# Patient Record
Sex: Male | Born: 2000 | Race: Black or African American | Hispanic: No | Marital: Single | Smoking: Never smoker
Health system: Southern US, Community
[De-identification: ages and names within clinical notes are randomized; demographics above are authoritative.]

## PROBLEM LIST (undated history)

## (undated) DIAGNOSIS — J45909 Unspecified asthma, uncomplicated: Secondary | ICD-10-CM

## (undated) HISTORY — DX: Unspecified asthma, uncomplicated: J45.909

---
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
## 2018-01-09 DIAGNOSIS — M79672 Pain in left foot: Secondary | ICD-10-CM | POA: Diagnosis not present

## 2018-01-09 DIAGNOSIS — E669 Obesity, unspecified: Secondary | ICD-10-CM | POA: Diagnosis not present

## 2018-01-22 DIAGNOSIS — S93402A Sprain of unspecified ligament of left ankle, initial encounter: Secondary | ICD-10-CM | POA: Diagnosis not present

## 2018-01-22 DIAGNOSIS — M25572 Pain in left ankle and joints of left foot: Secondary | ICD-10-CM | POA: Diagnosis not present

## 2018-03-28 ENCOUNTER — Encounter (HOSPITAL_COMMUNITY): Payer: Self-pay | Admitting: *Deleted

## 2018-03-28 ENCOUNTER — Emergency Department (HOSPITAL_COMMUNITY): Payer: Medicaid Other

## 2018-03-28 ENCOUNTER — Emergency Department (HOSPITAL_COMMUNITY)
Admission: EM | Admit: 2018-03-28 | Discharge: 2018-03-28 | Disposition: A | Discharge status: Home / Self Care | Payer: Medicaid Other | Attending: Emergency Medicine | Admitting: Emergency Medicine

## 2018-03-28 ENCOUNTER — Other Ambulatory Visit: Payer: Self-pay

## 2018-03-28 DIAGNOSIS — Z79899 Other long term (current) drug therapy: Secondary | ICD-10-CM | POA: Insufficient documentation

## 2018-03-28 DIAGNOSIS — M25572 Pain in left ankle and joints of left foot: Secondary | ICD-10-CM | POA: Insufficient documentation

## 2018-03-28 DIAGNOSIS — J45909 Unspecified asthma, uncomplicated: Secondary | ICD-10-CM | POA: Insufficient documentation

## 2018-03-28 MED ORDER — NAPROXEN 500 MG PO TABS: 500.0000 mg | ORAL_TABLET | Freq: Two times a day (BID) | ORAL | 0 refills | Status: AC

## 2018-03-28 NOTE — ED Triage Notes (Signed)
Pt in c/o L ankle pain with hx of injury in August 2019 after playing basketball with worsening pain with working, no new injury, pt ambulatory, A&O x4

## 2018-03-28 NOTE — Discharge Instructions (Addendum)
You were seen in the emergency department today for ankle pain.  Your x-ray did not show fracture dislocation.  We suggested she continue wearing her brace when you are up moving around.  We are giving a prescription for naproxen to help with pain.  - Naproxen is a nonsteroidal anti-inflammatory medication that will help with pain and swelling. Be sure to take this medication as prescribed with food, 1 pill every 12 hours,  It should be taken with food, as it can cause stomach upset, and more seriously, stomach bleeding. Do not take other nonsteroidal anti-inflammatory medications with this such as Advil, Motrin, Aleve, Mobic, Goodie Powder, or Motrin.    You make take Tylenol per over the counter dosing with these medications.   We have prescribed you new medication(s) today. Discuss the medications prescribed today with your pharmacist as they can have adverse effects and interactions with your other medicines including over the counter and prescribed medications. Seek medical evaluation if you start to experience new or abnormal symptoms after taking one of these medicines, seek care immediately if you start to experience difficulty breathing, feeling of your throat closing, facial swelling, or rash as these could be indications of a more serious allergic reaction   These follow-up with your previous orthopedic doctor or Dr. Everardo Pacific providing her discharge instructions within 1 week.  Return to the ER for new or worsening symptoms or any other concerns.

## 2018-03-28 NOTE — ED Provider Notes (Signed)
[facility] EMERGENCY DEPARTMENT Provider Note   CSN: [id number] Arrival date & time: 03/28/18  0901     History   Chief Complaint Chief Complaint  Patient presents with  . Ankle Pain    HPI Edward Goodman is a 17 y.o. male with a history of asthma who presents to the emergency department with complaints of left ankle pain.  Patient states that he injured his left ankle playing basketball in August of this year-release inversion type injury with popping sensation.  He was seen in urgent care at time of injury with negative x-ray and he states they told him they thought he might have torn a ligament.  He states he followed up with orthopedics prior to going back to school without intervention performed.  He states he is been on his feet and excessive amount at work which has seemed to aggravate the typical discomfort he has had since August.  It is worse with twisting/turning motions of the ankle.  No alleviating factors.  Start utilizing the brace previously provided without improvement.  No medications tried.  Denies fever, chills, change in color, swelling, numbness, feeling, or weakness.  HPI  Past Medical History:  Diagnosis Date  . Asthma     There are no active problems to display for this patient.   History reviewed. No pertinent surgical history.      Home Medications    Prior to Admission medications   Medication Sig Start Date End Date Taking? Authorizing Provider  acetaminophen (TYLENOL) 500 MG tablet Take 500 mg by mouth every 6 (six) hours as needed for headache (pain).    [provider]  albuterol (PROVENTIL HFA;VENTOLIN HFA) 108 (90 Base) MCG/ACT inhaler Inhale into the lungs every 6 (six) hours as needed for wheezing or shortness of breath.    [provider]  diphenhydrAMINE (BENADRYL) 25 mg capsule Take 1 capsule (25 mg total) by mouth every 6 (six) hours as needed. Patient taking differently: Take 25-50 mg by mouth  every 6 (six) hours as needed (allergic reaction).  10/14/16   Maxwell Caul, PA-C  famotidine (PEPCID) 20 MG tablet Take 1 tablet (20 mg total) by mouth daily. Patient not taking: Reported on 11/05/2017 10/14/16   Graciella Freer A, PA-C  fluticasone (FLONASE) 50 MCG/ACT nasal spray Place 2 sprays into both nostrils daily. Patient not taking: Reported on 11/05/2017 07/03/17   Caccavale, Sophia, PA-C  promethazine-dextromethorphan (PROMETHAZINE-DM) 6.25-15 MG/5ML syrup Take 5 mLs by mouth 4 (four) times daily as needed. Patient not taking: Reported on 11/05/2017 07/03/17   Alveria Apley, PA-C    Family History No family history on file.  Social History Social History   Tobacco Use  . Smoking status: Never Smoker  . Smokeless tobacco: Never Used  Substance Use Topics  . Alcohol use: No  . Drug use: No     Allergies   Banana   Review of Systems Review of Systems  Constitutional: Negative for chills and fever.  Musculoskeletal: Positive for arthralgias (L ankle).  Skin: Negative for color change.  Neurological: Negative for weakness and numbness.     Physical Exam Updated Vital Signs BP (!) 127/58 (BP Location: Right Arm)   Pulse 77   Temp 98.2 F (36.8 C) (Oral)   Resp 16   Ht 5\' 11"  (1.803 m)   Wt 102.1 kg   SpO2 100%   BMI 31.38 kg/m   Physical Exam  Constitutional: He appears well-developed and well-nourished. No distress.  HENT:  Head: Normocephalic and atraumatic.  Eyes: Conjunctivae are normal. Right eye exhibits no discharge. Left eye exhibits no discharge.  Cardiovascular:  2+ symmetric DP/PT pulses.  Musculoskeletal:  Lower extremities: No obvious deformity, appreciable swelling, erythema, ecchymosis, or open wounds.  Patient has full active range of motion bilateral knees, ankles, and all digits.  He is tender to palpation over the left lateral ankle ligaments.  There does not appear to be any point/focal bony tenderness.  No malleolar tenderness.   No tenderness at the base of the fifth, navicular bone, or the head of the fibula.  He is neurovascularly intact distally.  Neurological: He is alert.  Clear speech.  Sensation grossly intact bilateral lower extremities.  5 out of 5 strength plantar dorsiflexion bilaterally.  Gait is intact.  Psychiatric: He has a normal mood and affect. His behavior is normal. Thought content normal.  Nursing note and vitals reviewed.    ED Treatments / Results  Labs (all labs ordered are listed, but only abnormal results are displayed) Labs Reviewed - No data to display  EKG None  Radiology Dg Ankle Complete Left  Result Date: 03/28/2018 CLINICAL DATA:  17 year old male status post twisting injury playing basketball. Pain and swelling. EXAM: LEFT ANKLE COMPLETE - 3+ VIEW COMPARISON:  None. FINDINGS: Bone mineralization is within normal limits. Skeletally mature. No joint effusion is evident. Talar dome intact. Mortise joint alignment within normal limits. No fracture. Incidental os trigonum. IMPRESSION: No osseous abnormality identified. Electronically Signed   By: Odessa Fleming M.D.   On: 03/28/2018 09:42    Procedures Procedures (including critical care time)  Medications Ordered in ED Medications - No data to display   Initial Impression / Assessment and Plan / ED Course  I have reviewed the triage vital signs and the nursing notes.  Pertinent labs & imaging results that were available during my care of the patient were reviewed by me and considered in my medical decision making (see chart for details).    Patient presents to the ED with complaints of pain to the left ankle which has been present since injury in 12/2017, worse recently with being on his feet at work. Exam without obvious deformity or open wounds. ROM intact. Tender to palpation over lateral ligaments. NVI distally. No erythema/warmth/fevers to raise concern for infectious etiology. Presentation does not seem consistent with gout.  Xray negative for fracture/dislocation. Recommended continued use of ankle brace with ortho follow up. Prescription for naproxen has been provided. I discussed results, treatment plan, need for follow-up, and return precautions with the patient. Provided opportunity for questions, patient confirmed understanding and are in agreement with plan.   Final Clinical Impressions(s) / ED Diagnoses   Final diagnoses:  Left ankle pain, unspecified chronicity    ED Discharge Orders         Ordered    naproxen (NAPROSYN) 500 MG tablet  2 times daily     03/28/18 0958           Cherly Anderson, PA-C 03/28/18 8119    Marily Memos, MD 03/28/18 1128

## 2018-05-22 ENCOUNTER — Emergency Department (HOSPITAL_COMMUNITY)
Admission: EM | Admit: 2018-05-22 | Discharge: 2018-05-22 | Disposition: A | Discharge status: Home / Self Care | Payer: Medicaid Other | Attending: Emergency Medicine | Admitting: Emergency Medicine

## 2018-05-22 ENCOUNTER — Encounter (HOSPITAL_COMMUNITY): Payer: Self-pay | Admitting: *Deleted

## 2018-05-22 DIAGNOSIS — N4889 Other specified disorders of penis: Secondary | ICD-10-CM | POA: Insufficient documentation

## 2018-05-22 DIAGNOSIS — J45909 Unspecified asthma, uncomplicated: Secondary | ICD-10-CM | POA: Insufficient documentation

## 2018-05-22 DIAGNOSIS — Z202 Contact with and (suspected) exposure to infections with a predominantly sexual mode of transmission: Secondary | ICD-10-CM

## 2018-05-22 DIAGNOSIS — R369 Urethral discharge, unspecified: Secondary | ICD-10-CM

## 2018-05-22 LAB — URINALYSIS, ROUTINE W REFLEX MICROSCOPIC
Bacteria, UA: NONE SEEN
Bilirubin Urine: NEGATIVE
GLUCOSE, UA: NEGATIVE mg/dL
Hgb urine dipstick: NEGATIVE
KETONES UR: NEGATIVE mg/dL
LEUKOCYTES UA: NEGATIVE
Nitrite: NEGATIVE
Protein, ur: 30 mg/dL — AB
Specific Gravity, Urine: 1.021 (ref 1.005–1.030)
pH: 9 — ABNORMAL HIGH (ref 5.0–8.0)

## 2018-05-22 MED ORDER — AZITHROMYCIN 250 MG PO TABS
1000.0000 mg | ORAL_TABLET | Freq: Once | ORAL | Status: AC
  Administered 2018-05-22: 1000 mg via ORAL
  Filled 2018-05-22: qty 4

## 2018-05-22 MED ORDER — CEFTRIAXONE SODIUM 250 MG IJ SOLR
250.0000 mg | Freq: Once | INTRAMUSCULAR | Status: AC
Start: 2018-05-22 — End: 2018-05-22
  Administered 2018-05-22: 250 mg via INTRAMUSCULAR
  Filled 2018-05-22: qty 250
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
## 2018-05-22 NOTE — ED Notes (Signed)
D/c reviewed with patient 
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
## 2018-05-22 NOTE — Discharge Instructions (Signed)
Please read the instructions below.  Talk with your primary care provider about any new medications.  You have been treated today for gonorrhea and chlamydia. You will receive a call from the hospital if your test results come back positive. Avoid sexual activity until you know your test results. If your results come back positive, it is important that you inform all of your sexual partners. Report to the health department for follow up. Return to the ER for new or worsening symptoms.

## 2018-05-23 LAB — HIV ANTIBODY (ROUTINE TESTING W REFLEX): HIV Screen 4th Generation wRfx: NONREACTIVE

## 2018-05-23 LAB — RPR: RPR: NONREACTIVE

## 2018-05-24 LAB — GC/CHLAMYDIA PROBE AMP (~~LOC~~) NOT AT ARMC
Chlamydia: NEGATIVE
NEISSERIA GONORRHEA: NEGATIVE

## 2018-08-12 ENCOUNTER — Encounter (HOSPITAL_COMMUNITY): Payer: Self-pay | Admitting: *Deleted

## 2018-08-12 ENCOUNTER — Emergency Department (HOSPITAL_COMMUNITY)
Admission: EM | Admit: 2018-08-12 | Discharge: 2018-08-12 | Disposition: A | Discharge status: Home / Self Care | Payer: Medicaid Other | Attending: Emergency Medicine | Admitting: Emergency Medicine

## 2018-08-12 ENCOUNTER — Other Ambulatory Visit: Payer: Self-pay

## 2018-08-12 ENCOUNTER — Emergency Department (HOSPITAL_COMMUNITY): Payer: Medicaid Other

## 2018-08-12 DIAGNOSIS — J45909 Unspecified asthma, uncomplicated: Secondary | ICD-10-CM | POA: Insufficient documentation

## 2018-08-12 DIAGNOSIS — S62511A Displaced fracture of proximal phalanx of right thumb, initial encounter for closed fracture: Secondary | ICD-10-CM | POA: Diagnosis not present

## 2018-08-12 DIAGNOSIS — Y999 Unspecified external cause status: Secondary | ICD-10-CM | POA: Diagnosis not present

## 2018-08-12 DIAGNOSIS — Z79899 Other long term (current) drug therapy: Secondary | ICD-10-CM | POA: Insufficient documentation

## 2018-08-12 DIAGNOSIS — S60011A Contusion of right thumb without damage to nail, initial encounter: Secondary | ICD-10-CM

## 2018-08-12 DIAGNOSIS — S60931A Unspecified superficial injury of right thumb, initial encounter: Secondary | ICD-10-CM | POA: Diagnosis present

## 2018-08-12 DIAGNOSIS — Y939 Activity, unspecified: Secondary | ICD-10-CM | POA: Insufficient documentation

## 2018-08-12 DIAGNOSIS — Y929 Unspecified place or not applicable: Secondary | ICD-10-CM | POA: Insufficient documentation

## 2018-08-12 DIAGNOSIS — S62501A Fracture of unspecified phalanx of right thumb, initial encounter for closed fracture: Secondary | ICD-10-CM | POA: Diagnosis not present

## 2018-08-12 DIAGNOSIS — S62646A Nondisplaced fracture of proximal phalanx of right little finger, initial encounter for closed fracture: Secondary | ICD-10-CM | POA: Diagnosis not present

## 2018-08-12 DIAGNOSIS — W228XXA Striking against or struck by other objects, initial encounter: Secondary | ICD-10-CM | POA: Insufficient documentation

## 2018-08-12 NOTE — ED Triage Notes (Signed)
Pt reported  If the x-ray  Is not ready in 15 mins he was going to leave.
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
## 2018-08-12 NOTE — Discharge Instructions (Signed)
Use ice Tylenol and ibuprofen as needed for swelling and pain. See sports medicine or family doctor if no improvement in 1 week.
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
## 2018-08-12 NOTE — ED Triage Notes (Signed)
PT walked out  With out  Follow up on x-ray.

## 2018-08-27 ENCOUNTER — Encounter: Payer: Self-pay | Admitting: Family Medicine

## 2018-08-27 ENCOUNTER — Other Ambulatory Visit: Payer: Self-pay

## 2018-08-27 ENCOUNTER — Ambulatory Visit (INDEPENDENT_AMBULATORY_CARE_PROVIDER_SITE_OTHER): Payer: Medicaid Other | Admitting: Family Medicine

## 2018-08-27 VITALS — BP 133/56 | HR 66 | Temp 98.2°F | Ht 71.0 in | Wt 256.0 lb

## 2018-08-27 DIAGNOSIS — S6991XA Unspecified injury of right wrist, hand and finger(s), initial encounter: Secondary | ICD-10-CM
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
## 2018-08-27 NOTE — Patient Instructions (Signed)
You have a small avulsion fracture at the base of your thumb. Wear thumb spica brace at all times except to wash the area, ice it for 5 weeks. Ice the area 15 minutes at a time 3-4 times a day. Tylenol 500mg  1-2 tabs three times a day as needed for pain. Ibuprofen 600mg  three times a day with food for pain and inflammation. Follow up with me in 5 visits either via virtual visit or in person.

## 2018-11-11 DIAGNOSIS — Z113 Encounter for screening for infections with a predominantly sexual mode of transmission: Secondary | ICD-10-CM | POA: Diagnosis not present

## 2019-08-10 ENCOUNTER — Emergency Department (HOSPITAL_COMMUNITY)
Admission: EM | Admit: 2019-08-10 | Discharge: 2019-08-10 | Disposition: A | Discharge status: Home / Self Care | Payer: Medicaid Other | Attending: Emergency Medicine | Admitting: Emergency Medicine

## 2019-08-10 ENCOUNTER — Encounter (HOSPITAL_COMMUNITY): Payer: Self-pay | Admitting: Emergency Medicine

## 2019-08-10 ENCOUNTER — Other Ambulatory Visit: Payer: Self-pay

## 2019-08-10 ENCOUNTER — Emergency Department (HOSPITAL_COMMUNITY): Payer: Medicaid Other

## 2019-08-10 DIAGNOSIS — Z79899 Other long term (current) drug therapy: Secondary | ICD-10-CM | POA: Diagnosis not present

## 2019-08-10 DIAGNOSIS — S8001XA Contusion of right knee, initial encounter: Secondary | ICD-10-CM | POA: Diagnosis not present

## 2019-08-10 DIAGNOSIS — Y92810 Car as the place of occurrence of the external cause: Secondary | ICD-10-CM | POA: Insufficient documentation

## 2019-08-10 DIAGNOSIS — Y999 Unspecified external cause status: Secondary | ICD-10-CM | POA: Insufficient documentation

## 2019-08-10 DIAGNOSIS — X58XXXA Exposure to other specified factors, initial encounter: Secondary | ICD-10-CM | POA: Insufficient documentation

## 2019-08-10 DIAGNOSIS — Y9389 Activity, other specified: Secondary | ICD-10-CM | POA: Insufficient documentation

## 2019-08-10 DIAGNOSIS — S79911A Unspecified injury of right hip, initial encounter: Secondary | ICD-10-CM | POA: Diagnosis not present

## 2019-08-10 DIAGNOSIS — S8991XA Unspecified injury of right lower leg, initial encounter: Secondary | ICD-10-CM | POA: Diagnosis not present
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
## 2019-08-10 NOTE — Discharge Instructions (Addendum)
Take Tylenol and ibuprofen as needed for pain. Practice range of motion exercises to prevent stiffness. Return to the ER if you start to have any worsening pain, injuries or falls, numbness or swelling in her joints.
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
## 2019-08-10 NOTE — ED Notes (Signed)
Patient ambulated from waiting room to [facility] with no assistance.

## 2019-11-25 ENCOUNTER — Emergency Department (HOSPITAL_COMMUNITY)
Admission: EM | Admit: 2019-11-25 | Discharge: 2019-11-25 | Disposition: A | Discharge status: Home / Self Care | Payer: Medicaid Other | Attending: Emergency Medicine | Admitting: Emergency Medicine

## 2019-11-25 DIAGNOSIS — Z5321 Procedure and treatment not carried out due to patient leaving prior to being seen by health care provider: Secondary | ICD-10-CM | POA: Diagnosis not present

## 2019-11-25 DIAGNOSIS — R6889 Other general symptoms and signs: Secondary | ICD-10-CM | POA: Diagnosis not present

## 2019-11-25 NOTE — ED Triage Notes (Signed)
Pt states he was feeling ill earlier today, reports he feels fine now. Requesting a doctor's note.

## 2019-11-25 NOTE — ED Notes (Signed)
No answer for vitals x2 and room x2

## 2019-12-22 DIAGNOSIS — Z03818 Encounter for observation for suspected exposure to other biological agents ruled out: Secondary | ICD-10-CM | POA: Diagnosis not present

## 2019-12-22 DIAGNOSIS — Z20822 Contact with and (suspected) exposure to covid-19: Secondary | ICD-10-CM | POA: Diagnosis not present

## 2020-01-11 DIAGNOSIS — Z20828 Contact with and (suspected) exposure to other viral communicable diseases: Secondary | ICD-10-CM | POA: Diagnosis not present

## 2020-11-25 ENCOUNTER — Other Ambulatory Visit: Payer: Self-pay

## 2020-11-25 ENCOUNTER — Ambulatory Visit (HOSPITAL_COMMUNITY)
Admission: EM | Admit: 2020-11-25 | Discharge: 2020-11-25 | Disposition: A | Discharge status: Home / Self Care | Payer: Medicaid Other | Attending: Psychiatry | Admitting: Psychiatry

## 2020-11-25 DIAGNOSIS — Z62820 Parent-biological child conflict: Secondary | ICD-10-CM | POA: Insufficient documentation
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
## 2020-11-25 NOTE — Discharge Instructions (Addendum)
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
## 2020-11-25 NOTE — Discharge Summary (Signed)
Edward Goodman to be D/C'd Home per MP order. Discussed with the patient and all questions fully answered.An After Visit Summary was printed and given to the patient.  Patient escorted out and D/C home via private auto.  Dickie La  11/25/2020 5:51 PM

## 2020-11-25 NOTE — Progress Notes (Addendum)
Note deleted by Thereasa Parkin

## 2020-12-10 ENCOUNTER — Encounter (HOSPITAL_COMMUNITY): Payer: Self-pay | Admitting: Emergency Medicine

## 2020-12-10 ENCOUNTER — Emergency Department (HOSPITAL_COMMUNITY): Payer: Medicaid Other

## 2020-12-10 ENCOUNTER — Emergency Department (HOSPITAL_COMMUNITY)
Admission: EM | Admit: 2020-12-10 | Discharge: 2020-12-10 | Disposition: A | Discharge status: Home / Self Care | Payer: Medicaid Other | Attending: Emergency Medicine | Admitting: Emergency Medicine

## 2020-12-10 DIAGNOSIS — Y92410 Unspecified street and highway as the place of occurrence of the external cause: Secondary | ICD-10-CM | POA: Insufficient documentation

## 2020-12-10 DIAGNOSIS — S199XXA Unspecified injury of neck, initial encounter: Secondary | ICD-10-CM | POA: Diagnosis not present

## 2020-12-10 DIAGNOSIS — S134XXA Sprain of ligaments of cervical spine, initial encounter: Secondary | ICD-10-CM | POA: Diagnosis not present

## 2020-12-10 DIAGNOSIS — J45909 Unspecified asthma, uncomplicated: Secondary | ICD-10-CM | POA: Insufficient documentation

## 2020-12-10 DIAGNOSIS — R200 Anesthesia of skin: Secondary | ICD-10-CM | POA: Insufficient documentation

## 2020-12-10 DIAGNOSIS — S0081XA Abrasion of other part of head, initial encounter: Secondary | ICD-10-CM | POA: Diagnosis not present

## 2020-12-10 DIAGNOSIS — M50222 Other cervical disc displacement at C5-C6 level: Secondary | ICD-10-CM | POA: Diagnosis not present

## 2020-12-10 DIAGNOSIS — S0990XA Unspecified injury of head, initial encounter: Secondary | ICD-10-CM | POA: Diagnosis not present

## 2020-12-10 DIAGNOSIS — M25511 Pain in right shoulder: Secondary | ICD-10-CM | POA: Diagnosis not present

## 2020-12-10 DIAGNOSIS — S139XXA Sprain of joints and ligaments of unspecified parts of neck, initial encounter: Secondary | ICD-10-CM

## 2020-12-10 MED ORDER — GADOBUTROL 1 MMOL/ML IV SOLN
10.0000 mL | Freq: Once | INTRAVENOUS | Status: AC | PRN
  Administered 2020-12-10: 10 mL via INTRAVENOUS

## 2020-12-10 MED ORDER — CYCLOBENZAPRINE HCL 10 MG PO TABS: 10.0000 mg | ORAL_TABLET | Freq: Three times a day (TID) | ORAL | 0 refills | Status: DC | PRN

## 2020-12-10 MED ORDER — CYCLOBENZAPRINE HCL 10 MG PO TABS: 10.0000 mg | ORAL_TABLET | Freq: Three times a day (TID) | ORAL | 0 refills | Status: AC | PRN
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
## 2020-12-10 NOTE — ED Notes (Signed)
The pt returned from [facility] for his iv to be placed.  He reported that he did not want to go back to mri  Dr Preston Fleeting notified pt agreed to have the iv placed  Mri notified that he now had an iv
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
## 2020-12-10 NOTE — ED Notes (Signed)
The pt insisted that the iv be removed as soon as he returned from [facility]  removed

## 2020-12-10 NOTE — ED Notes (Signed)
Pt taken back to [facility]

## 2020-12-10 NOTE — ED Notes (Signed)
Patient transported to CT 
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
## 2022-06-29 ENCOUNTER — Ambulatory Visit: Payer: Medicaid Other | Admitting: Internal Medicine

## 2022-08-30 ENCOUNTER — Ambulatory Visit (INDEPENDENT_AMBULATORY_CARE_PROVIDER_SITE_OTHER): Payer: Medicaid Other | Admitting: Primary Care

## 2022-09-19 ENCOUNTER — Encounter (INDEPENDENT_AMBULATORY_CARE_PROVIDER_SITE_OTHER): Payer: Self-pay

## 2022-09-19 ENCOUNTER — Ambulatory Visit (INDEPENDENT_AMBULATORY_CARE_PROVIDER_SITE_OTHER): Payer: Medicaid Other | Admitting: Primary Care

## 2022-09-20 ENCOUNTER — Ambulatory Visit (INDEPENDENT_AMBULATORY_CARE_PROVIDER_SITE_OTHER): Payer: Medicaid Other | Admitting: Primary Care

## 2022-11-02 ENCOUNTER — Ambulatory Visit (INDEPENDENT_AMBULATORY_CARE_PROVIDER_SITE_OTHER): Payer: Medicaid Other | Admitting: Primary Care

## 2023-01-27 IMAGING — MR MR CERVICAL SPINE WO/W CM
5 of 9 series · 19 of 48 positions shown · IV contrast (gadavist)
Comparison: None.

CLINICAL DATA: Neck trauma, focal neuro deficit or paresthesia (Age
16-64y)

EXAM:
MRI CERVICAL SPINE WITHOUT AND WITH CONTRAST
TECHNIQUE: Multiplanar and multiecho pulse sequences of the cervical spine, to
include the craniocervical junction and cervicothoracic junction,
were obtained without and with intravenous contrast.
CONTRAST:  10mL GADAVIST GADOBUTROL 1 MMOL/ML IV SOLN

[Series 2: T2 · sagittal · 3.0mm · 0.43mm/px · 2 of 18 slices shown (1 of 2)]
[im 1/18]
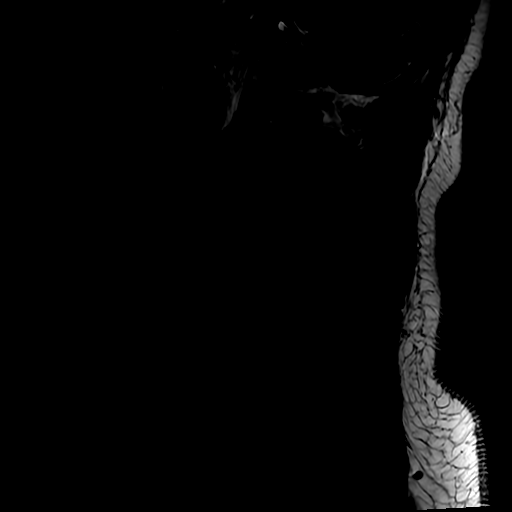
[im 18/18]
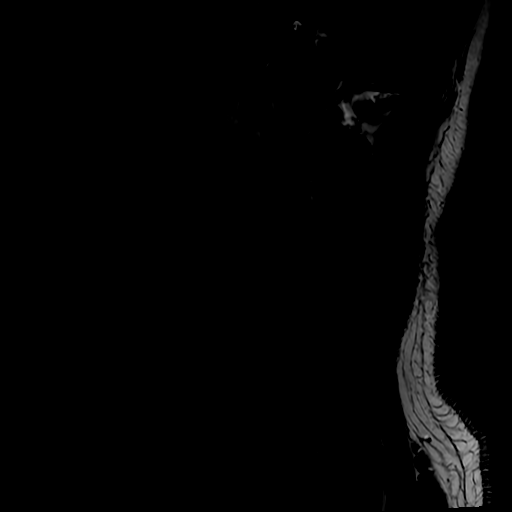

[Series 4: STIR · sagittal · 3.0mm · 0.43mm/px · 2 of 18 slices shown]
[im 1/18]
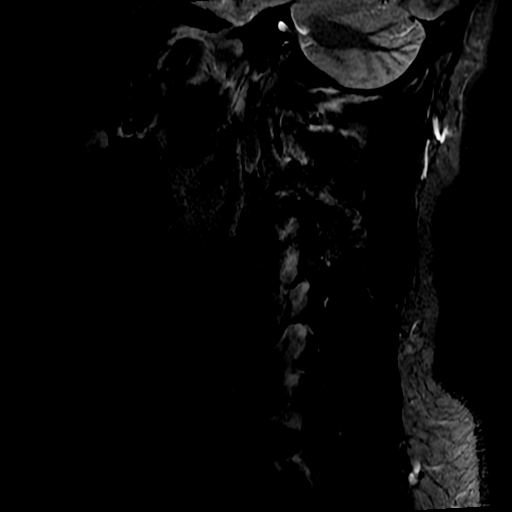
[im 9/18]
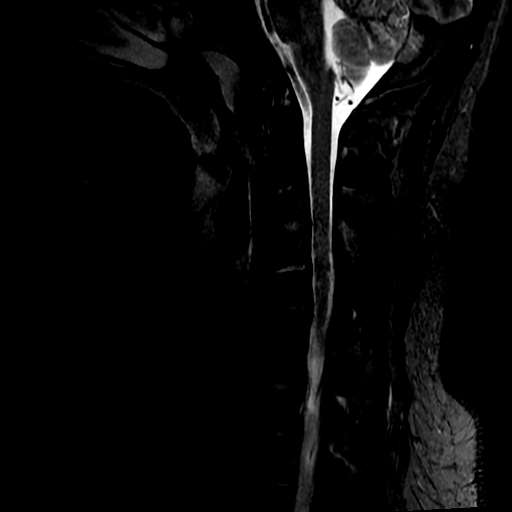

[Series 7: T2 · axial · 3.0mm · 0.35mm/px · z∈[-123,+13]mm · 6 of 43 slices shown (2 of 2)]
[im 1/43]
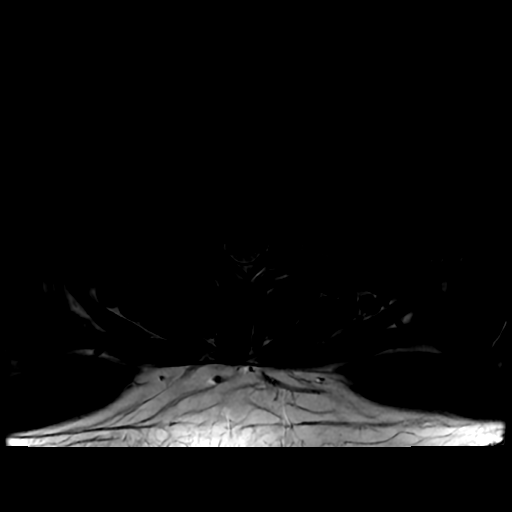
[im 9/43]
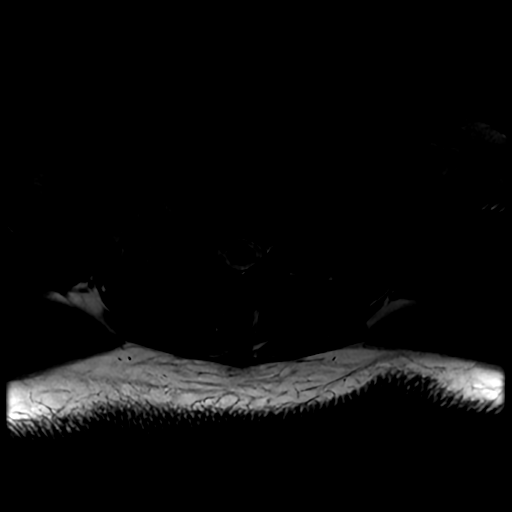
[im 17/43]
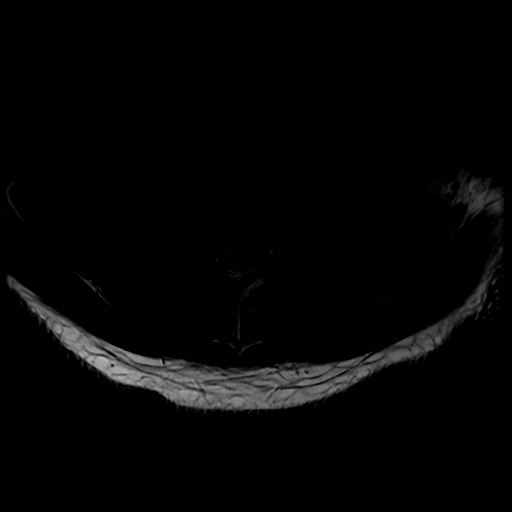
[im 26/43]
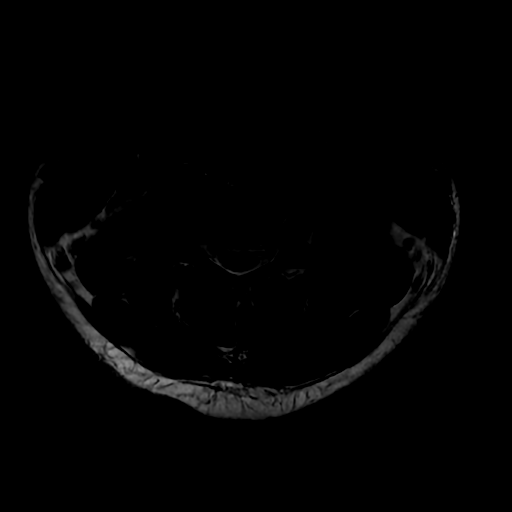
[im 34/43]
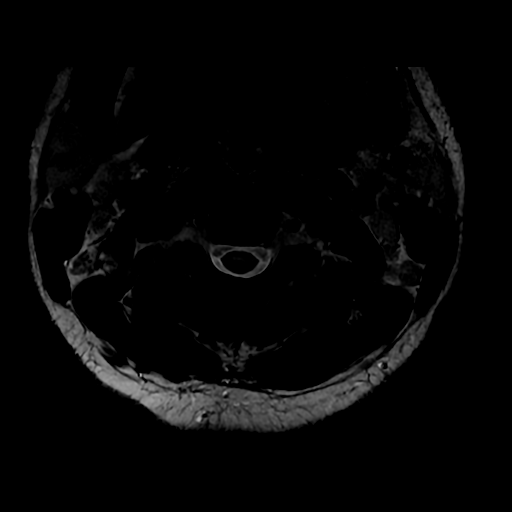
[im 43/43]
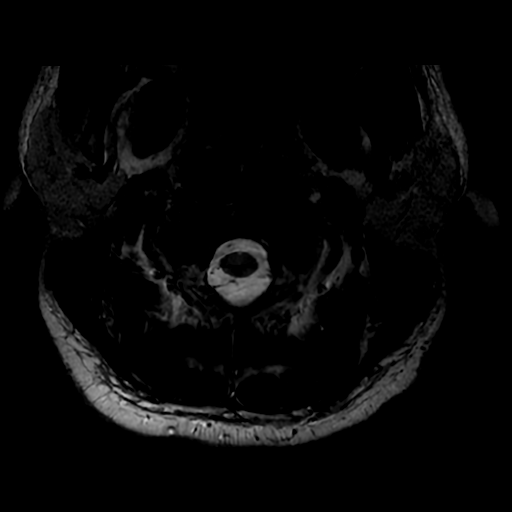

[Series 8: T1 · axial · non-contrast · 3.0mm · 0.35mm/px · z∈[-123,+13]mm · 6 of 43 slices shown]
[im 1/43]
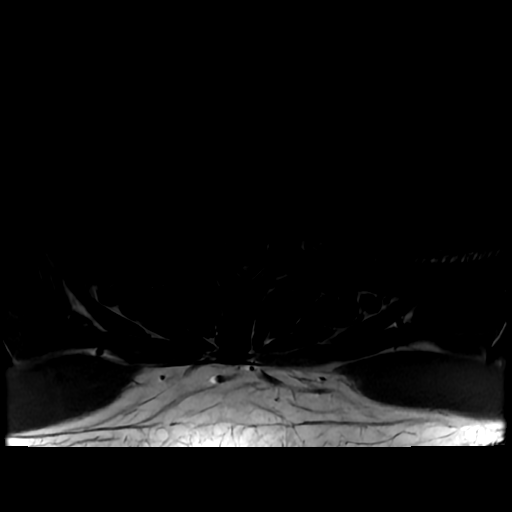
[im 9/43]
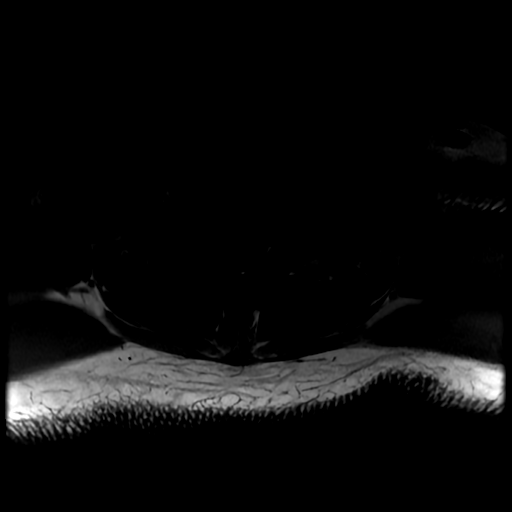
[im 17/43]
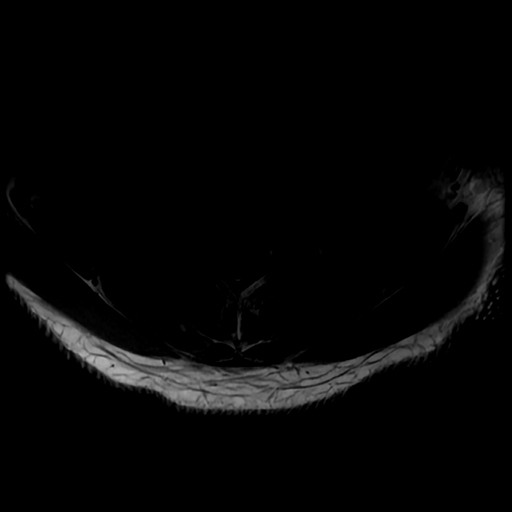
[im 26/43]
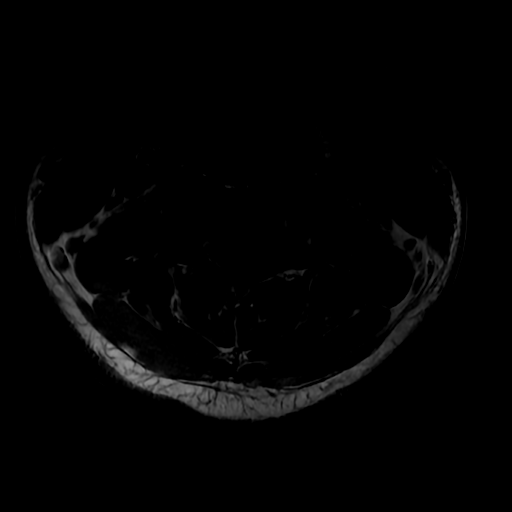
[im 34/43]
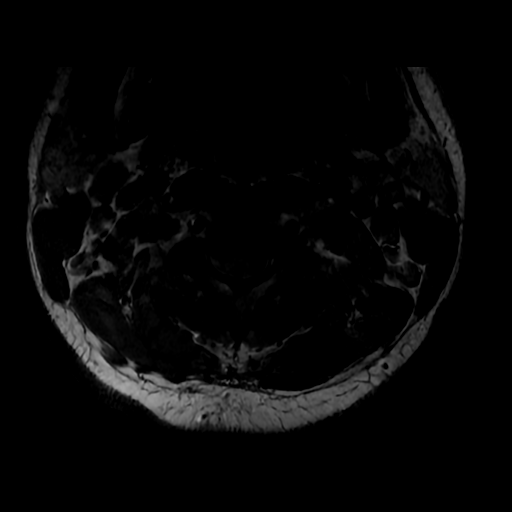
[im 43/43]
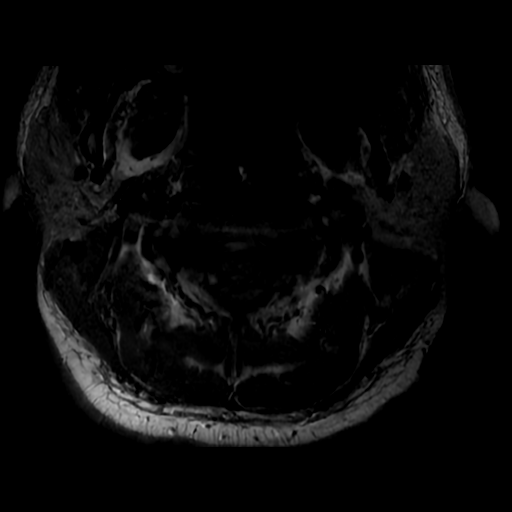

[Series 9: T1 fat-sat post-contrast · sagittal · 3.0mm · 0.43mm/px · 3 of 18 slices shown]
[im 1/18]
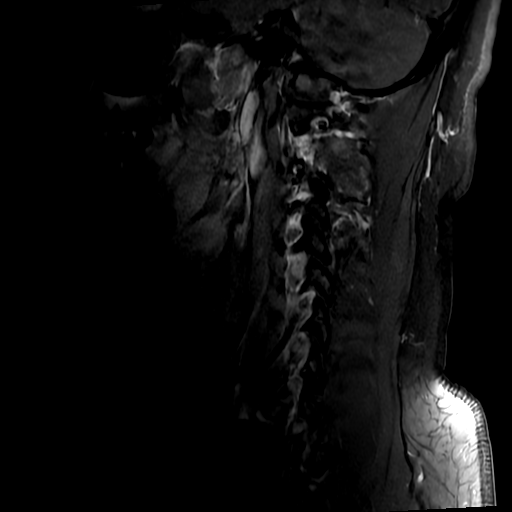
[im 9/18]
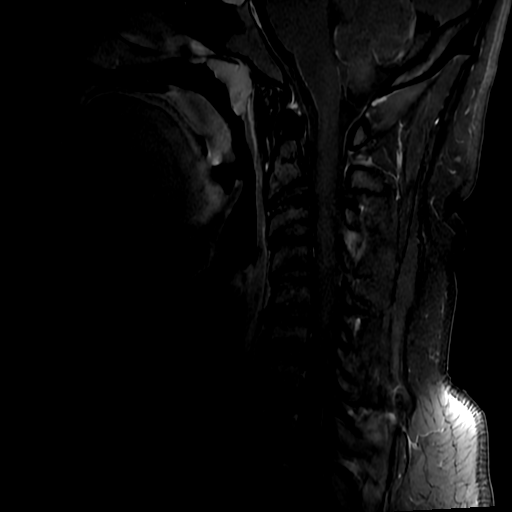
[im 18/18]
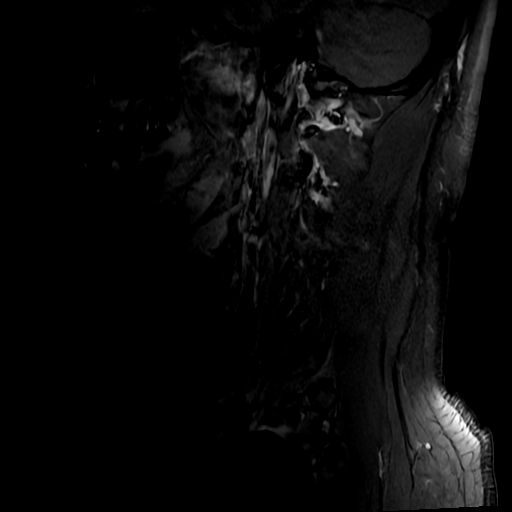

[19 of 48 positions shown; findings below may reference images not displayed]

FINDINGS: Alignment: Physiologic. Reversal of normal cervical lordosis may be
positional or due to muscle spasm.

Vertebrae: No fracture, evidence of discitis, or bone lesion.

Cord: Normal signal and morphology.

Posterior Fossa, vertebral arteries, paraspinal tissues: Negative.

Disc levels:

C1-2: Unremarkable.

C2-3: Normal disc space and facet joints. There is no spinal canal
stenosis. No neural foraminal stenosis.

C3-4: Normal disc space and facet joints. There is no spinal canal
stenosis. No neural foraminal stenosis.

C4-5: Normal disc space and facet joints. There is no spinal canal
stenosis. No neural foraminal stenosis.

C5-6: Small central disc protrusion. Normal facets. There is no
spinal canal stenosis. No neural foraminal stenosis.

C6-7: Normal disc space and facet joints. There is no spinal canal
stenosis. No neural foraminal stenosis.

C7-T1: Normal disc space and facet joints. There is no spinal canal
stenosis. No neural foraminal stenosis.
IMPRESSION: 1. Reversal of normal cervical lordosis may be positional or due to
muscle spasm.
2. Small central disc protrusion at C5-6 without associated
stenosis.
3. Otherwise normal MRI of the cervical spine.

## 2023-03-14 DIAGNOSIS — W230XXA Caught, crushed, jammed, or pinched between moving objects, initial encounter: Secondary | ICD-10-CM | POA: Diagnosis not present

## 2023-03-14 DIAGNOSIS — S93602A Unspecified sprain of left foot, initial encounter: Secondary | ICD-10-CM | POA: Diagnosis not present

## 2023-06-09 DIAGNOSIS — J0100 Acute maxillary sinusitis, unspecified: Secondary | ICD-10-CM | POA: Diagnosis not present

## 2023-06-09 DIAGNOSIS — J029 Acute pharyngitis, unspecified: Secondary | ICD-10-CM | POA: Diagnosis not present
